# Patient Record
Sex: Female | Born: 1975 | Race: Black or African American | Hispanic: No | Marital: Single | Smoking: Never smoker
Health system: Southern US, Community
[De-identification: ages and names within clinical notes are randomized; demographics above are authoritative.]

## PROBLEM LIST (undated history)

## (undated) DIAGNOSIS — G629 Polyneuropathy, unspecified: Secondary | ICD-10-CM

## (undated) DIAGNOSIS — H3530 Unspecified macular degeneration: Secondary | ICD-10-CM

---

## 1997-07-12 ENCOUNTER — Inpatient Hospital Stay (HOSPITAL_COMMUNITY): Admission: AD | Admit: 1997-07-12 | Discharge: 1997-07-12 | Payer: Self-pay | Admitting: *Deleted

## 1997-07-12 ENCOUNTER — Other Ambulatory Visit: Admission: RE | Admit: 1997-07-12 | Discharge: 1997-07-12 | Payer: Self-pay | Admitting: *Deleted

## 1997-07-14 ENCOUNTER — Inpatient Hospital Stay (HOSPITAL_COMMUNITY): Admission: AD | Admit: 1997-07-14 | Discharge: 1997-07-14 | Payer: Self-pay | Admitting: Obstetrics

## 1997-07-14 ENCOUNTER — Inpatient Hospital Stay (HOSPITAL_COMMUNITY): Admission: AD | Admit: 1997-07-14 | Discharge: 1997-07-14 | Payer: Self-pay | Admitting: Obstetrics & Gynecology

## 1997-07-19 ENCOUNTER — Inpatient Hospital Stay (HOSPITAL_COMMUNITY): Admission: AD | Admit: 1997-07-19 | Discharge: 1997-07-22 | Payer: Self-pay | Admitting: *Deleted

## 1997-07-23 ENCOUNTER — Inpatient Hospital Stay (HOSPITAL_COMMUNITY): Admission: AD | Admit: 1997-07-23 | Discharge: 1997-07-25 | Payer: Self-pay | Admitting: Obstetrics

## 2019-03-26 ENCOUNTER — Other Ambulatory Visit: Payer: Self-pay

## 2019-03-26 ENCOUNTER — Emergency Department: Payer: BC Managed Care – PPO

## 2019-03-26 ENCOUNTER — Emergency Department
Admission: EM | Admit: 2019-03-26 | Discharge: 2019-03-26 | Disposition: A | Discharge status: Home / Self Care | Payer: BC Managed Care – PPO | Attending: Emergency Medicine | Admitting: Emergency Medicine

## 2019-03-26 ENCOUNTER — Encounter: Payer: Self-pay | Admitting: Intensive Care

## 2019-03-26 DIAGNOSIS — R0789 Other chest pain: Secondary | ICD-10-CM | POA: Diagnosis not present

## 2019-03-26 DIAGNOSIS — Z5321 Procedure and treatment not carried out due to patient leaving prior to being seen by health care provider: Secondary | ICD-10-CM | POA: Insufficient documentation

## 2019-03-26 DIAGNOSIS — M546 Pain in thoracic spine: Secondary | ICD-10-CM | POA: Diagnosis not present

## 2019-03-26 HISTORY — DX: Unspecified macular degeneration: H35.30

## 2019-03-26 HISTORY — DX: Polyneuropathy, unspecified: G62.9

## 2019-03-26 LAB — TROPONIN I (HIGH SENSITIVITY): Troponin I (High Sensitivity): <2 ng/L (ref <18)

## 2019-03-26 LAB — CBC
HCT: 37.8 % (ref 36.0–46.0)
Hemoglobin: 13.8 g/dL (ref 12.0–15.0)
MCH: 27.9 pg (ref 26.0–34.0)
MCHC: 36.5 g/dL — ABNORMAL HIGH (ref 30.0–36.0)
MCV: 76.5 fL — ABNORMAL LOW (ref 80.0–100.0)
Platelets: 303 10*3/uL (ref 150–400)
RBC: 4.94 MIL/uL (ref 3.87–5.11)
RDW: 12.8 % (ref 11.5–15.5)
WBC: 5.8 10*3/uL (ref 4.0–10.5)
nRBC: 0 % (ref 0.0–0.2)

## 2019-03-26 LAB — BASIC METABOLIC PANEL
Anion gap: 10 (ref 5–15)
BUN: 7 mg/dL (ref 6–20)
CO2: 25 mmol/L (ref 22–32)
Calcium: 8.9 mg/dL (ref 8.9–10.3)
Chloride: 104 mmol/L (ref 98–111)
Creatinine, Ser: 0.84 mg/dL (ref 0.44–1.00)
GFR calc Af Amer: >60 mL/min (ref >60)
GFR calc non Af Amer: >60 mL/min (ref >60)
Glucose, Bld: 83 mg/dL (ref 70–99)
Potassium: 3.7 mmol/L (ref 3.5–5.1)
Sodium: 139 mmol/L (ref 135–145)

## 2019-03-26 LAB — LIPASE, BLOOD: Lipase: 24 U/L (ref 11–51)

## 2019-03-26 NOTE — ED Notes (Signed)
Pt up to stat desk letting registration know that she is leaving, pt asked to wait for a moment while Epic was pulling back up and pt states "you can write it down I'm leaving" and proceeds to walk away

## 2019-03-26 NOTE — ED Triage Notes (Signed)
Patient reports having endoscopy yesterday and now experiencing chest pain, upper back pain, sore throat, and unable to eat or swallow without pain. Ambulatory into triage with no problems.

## 2019-03-29 ENCOUNTER — Telehealth: Payer: Self-pay | Admitting: Emergency Medicine

## 2019-03-29 NOTE — Telephone Encounter (Signed)
Called patient due to lwot to inquire about condition and follow up plans. Left message.   

## 2021-05-22 ENCOUNTER — Encounter: Payer: BC Managed Care – PPO | Admitting: Obstetrics and Gynecology

## 2021-06-02 IMAGING — CR DG CHEST 2V
1 series · 2 of 2 positions shown · non-contrast
Comparison: 02/13/2017

CLINICAL DATA: Patient states endoscopy yesterday and now
experiencing chest pain, upper back pain, sore throat, and unable to
eat or swallow without pain. Hx of EOE, and stomach tumors.

EXAM:
CHEST - 2 VIEW

[Series 1: w chest pa · 0.14mm/px · 2 of 2 slices shown]
[im 1/2]
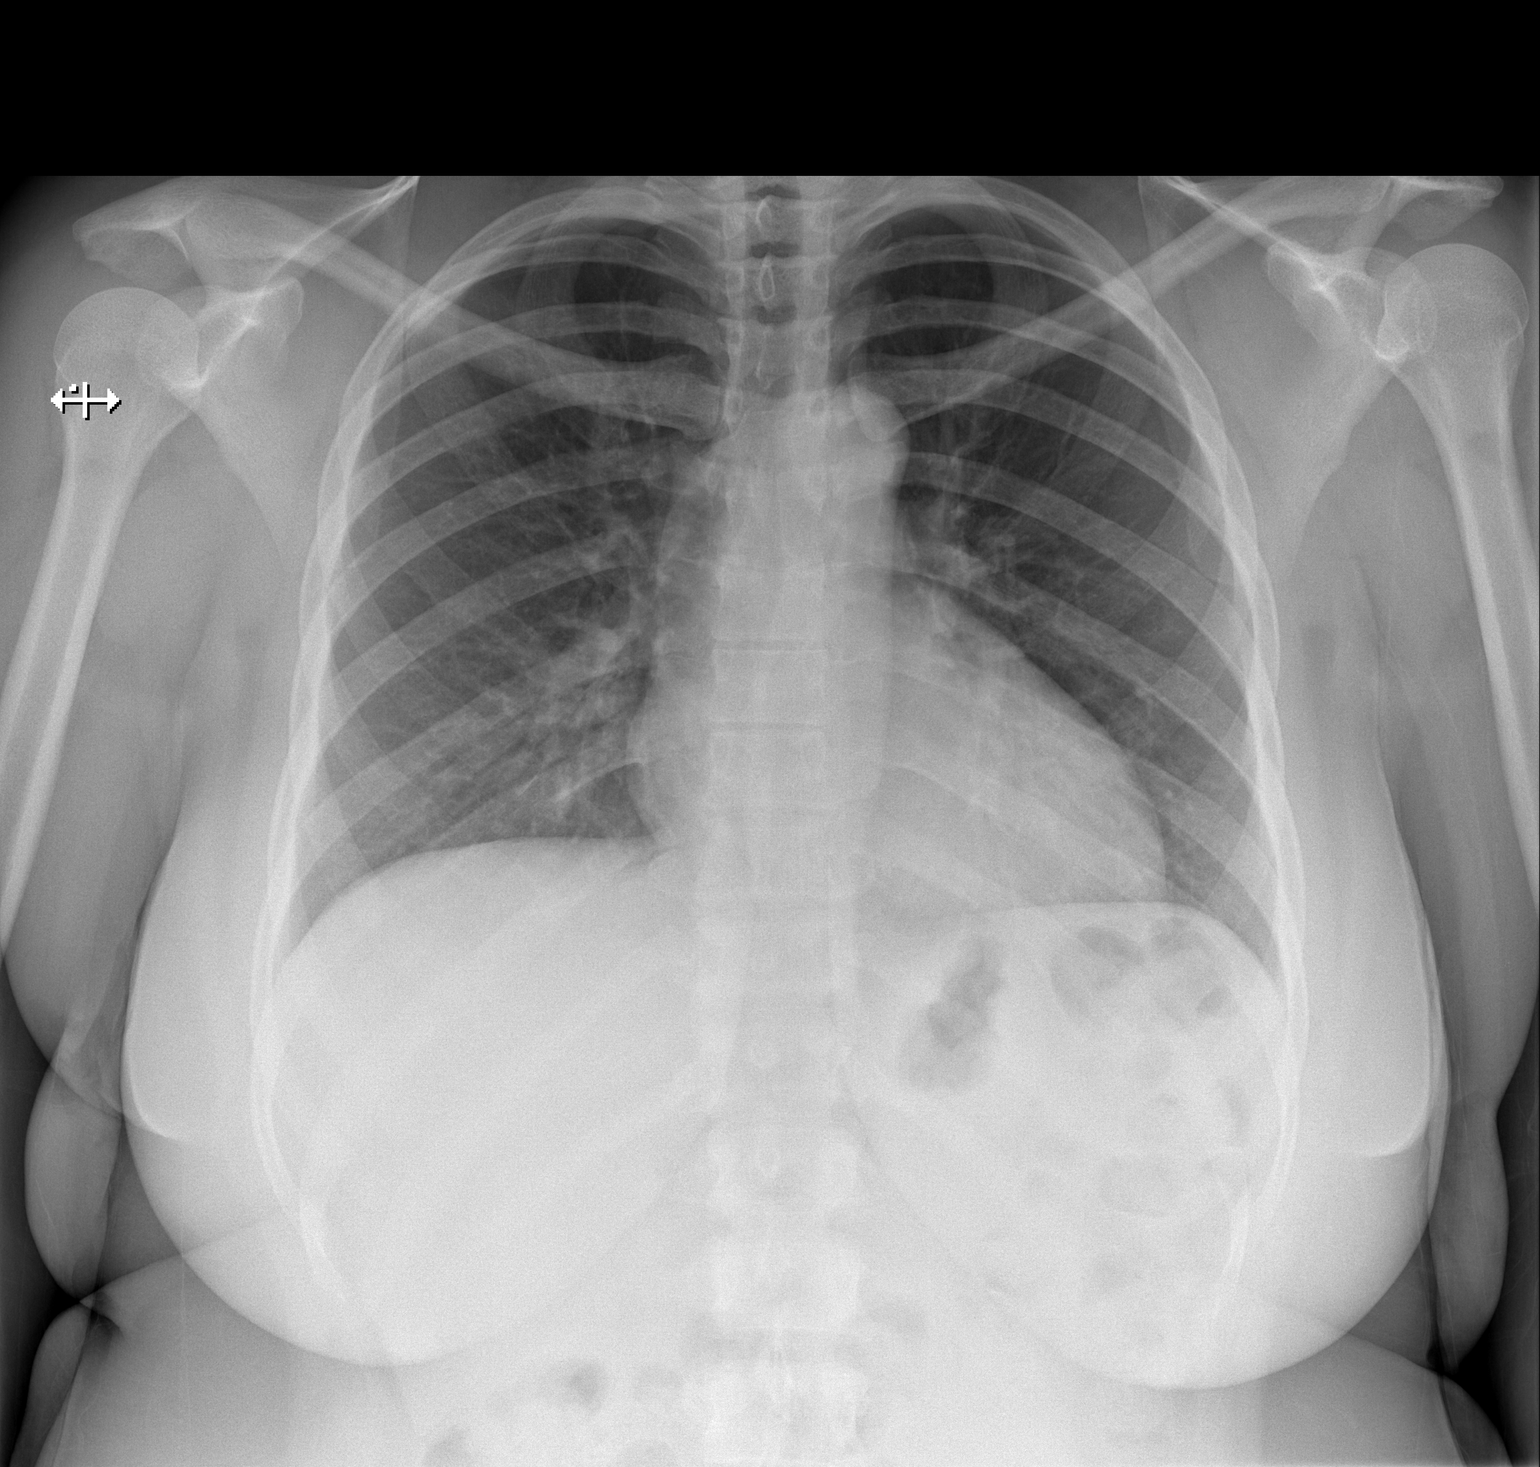
[im 2/2]
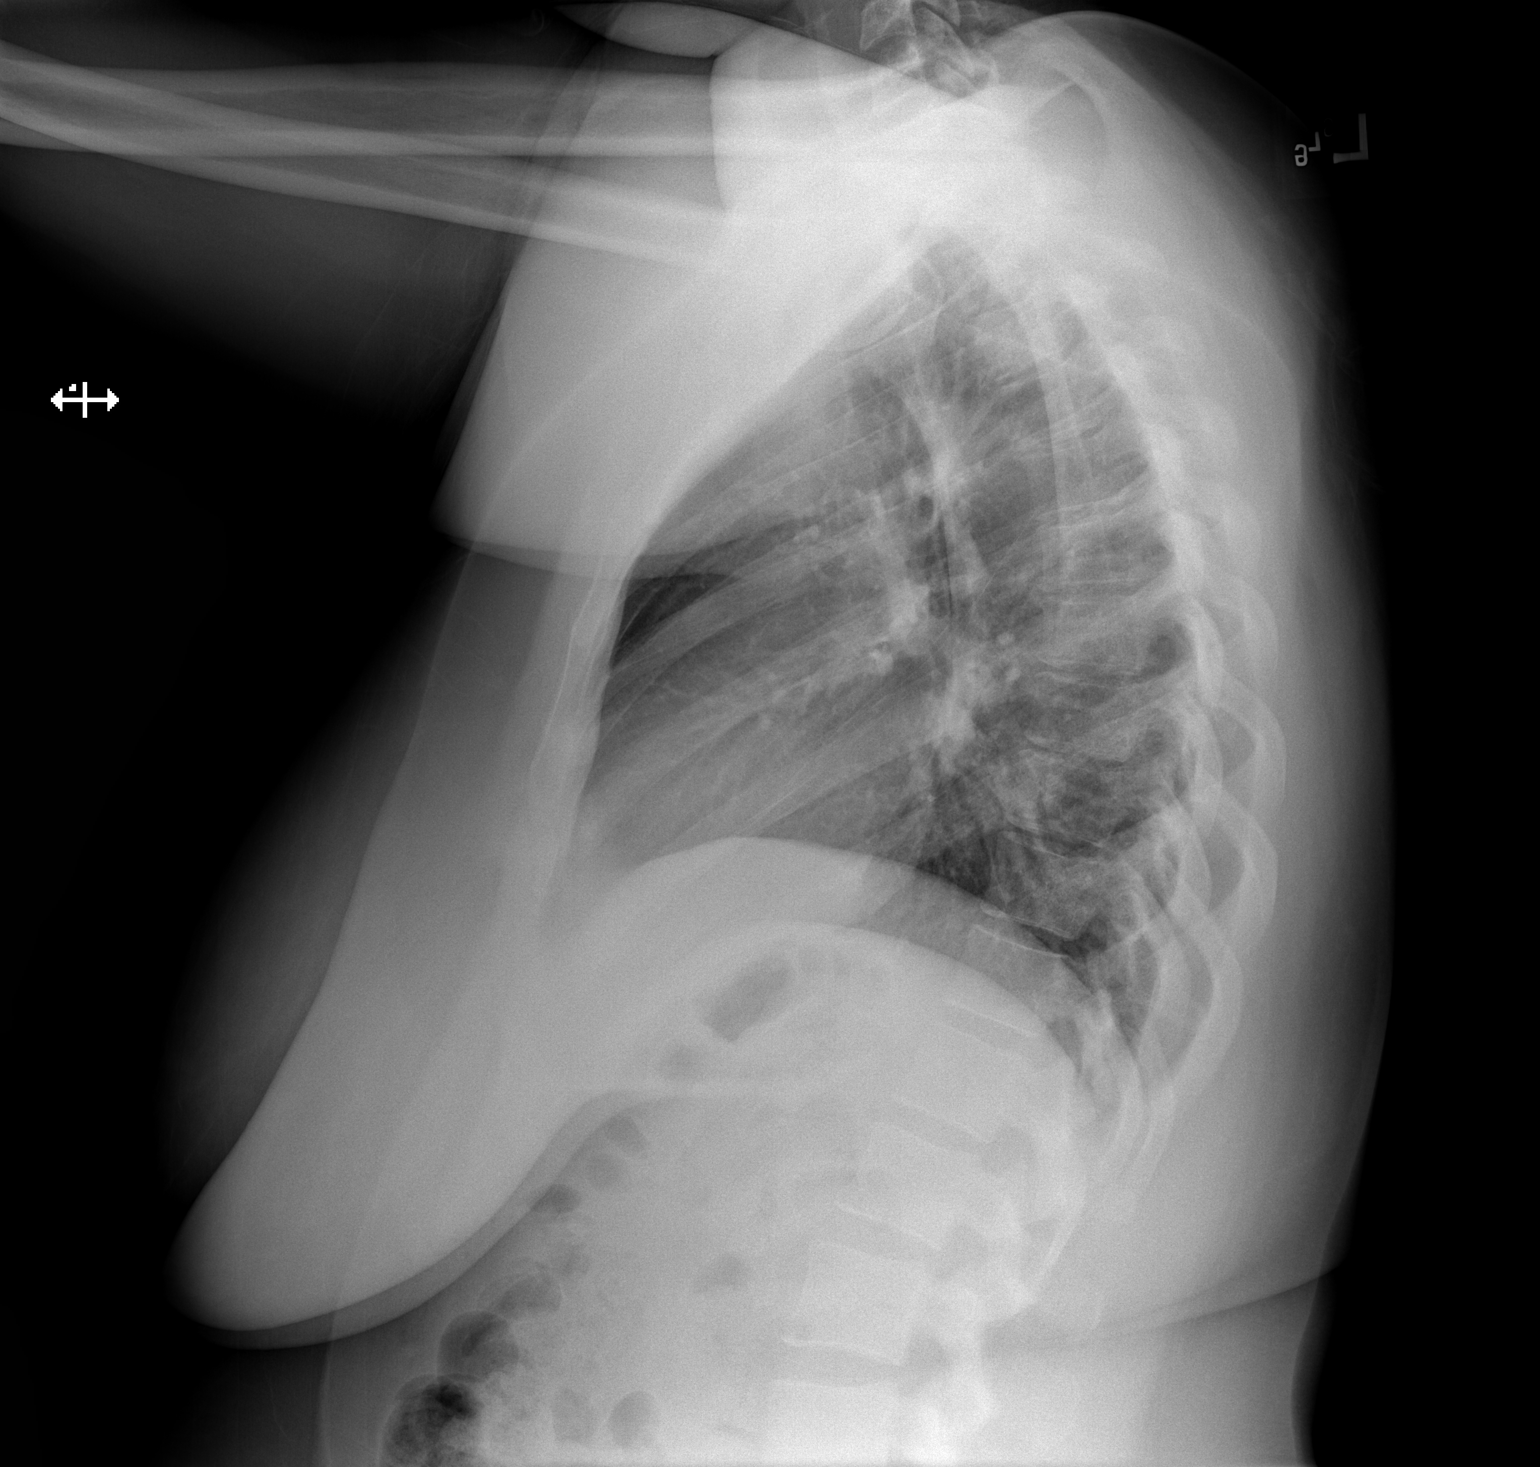

[2 of 2 positions shown; findings below may reference images not displayed]

FINDINGS: The heart size and mediastinal contours are within normal limits.
Both lungs are clear. No pleural effusion or pneumothorax. The
visualized skeletal structures are unremarkable.
IMPRESSION: Normal chest radiographs.
